# Patient Record
Sex: Female | Born: 1965 | Race: Black or African American | Hispanic: No | Marital: Married | Smoking: Never smoker
Health system: Southern US, Community
[De-identification: ages and names within clinical notes are randomized; demographics above are authoritative.]

## PROBLEM LIST (undated history)

## (undated) DIAGNOSIS — R809 Proteinuria, unspecified: Secondary | ICD-10-CM

## (undated) DIAGNOSIS — K589 Irritable bowel syndrome without diarrhea: Secondary | ICD-10-CM

## (undated) DIAGNOSIS — J302 Other seasonal allergic rhinitis: Secondary | ICD-10-CM

## (undated) HISTORY — PX: TUBAL LIGATION: SHX77

## (undated) HISTORY — DX: Other seasonal allergic rhinitis: J30.2

## (undated) HISTORY — DX: Proteinuria, unspecified: R80.9

## (undated) HISTORY — DX: Irritable bowel syndrome, unspecified: K58.9

---

## 1999-04-10 ENCOUNTER — Other Ambulatory Visit: Admission: RE | Admit: 1999-04-10 | Discharge: 1999-04-10 | Payer: Self-pay | Admitting: Internal Medicine

## 2000-02-05 ENCOUNTER — Other Ambulatory Visit: Admission: RE | Admit: 2000-02-05 | Discharge: 2000-02-05 | Payer: Self-pay | Admitting: Obstetrics and Gynecology

## 2000-02-24 ENCOUNTER — Encounter: Payer: Self-pay | Admitting: Obstetrics and Gynecology

## 2000-02-24 ENCOUNTER — Inpatient Hospital Stay (HOSPITAL_COMMUNITY): Admission: RE | Admit: 2000-02-24 | Discharge: 2000-02-24 | Payer: Self-pay | Admitting: Obstetrics and Gynecology

## 2000-02-25 ENCOUNTER — Encounter (INDEPENDENT_AMBULATORY_CARE_PROVIDER_SITE_OTHER): Payer: Self-pay

## 2000-02-25 ENCOUNTER — Ambulatory Visit (HOSPITAL_COMMUNITY): Admission: RE | Admit: 2000-02-25 | Discharge: 2000-02-25 | Payer: Self-pay | Admitting: Obstetrics and Gynecology

## 2000-09-16 HISTORY — PX: DILATION AND CURETTAGE OF UTERUS: SHX78

## 2001-03-25 ENCOUNTER — Other Ambulatory Visit: Admission: RE | Admit: 2001-03-25 | Discharge: 2001-03-25 | Payer: Self-pay | Admitting: Gynecology

## 2001-10-07 ENCOUNTER — Encounter: Payer: Self-pay | Admitting: Obstetrics and Gynecology

## 2001-10-07 ENCOUNTER — Inpatient Hospital Stay (HOSPITAL_COMMUNITY): Admission: AD | Admit: 2001-10-07 | Discharge: 2001-10-07 | Payer: Self-pay | Admitting: Obstetrics and Gynecology

## 2001-10-09 ENCOUNTER — Inpatient Hospital Stay (HOSPITAL_COMMUNITY): Admission: AD | Admit: 2001-10-09 | Discharge: 2001-10-12 | Payer: Self-pay | Admitting: Obstetrics and Gynecology

## 2001-10-13 ENCOUNTER — Encounter: Admission: RE | Admit: 2001-10-13 | Discharge: 2001-11-12 | Payer: Self-pay | Admitting: Obstetrics and Gynecology

## 2001-11-13 ENCOUNTER — Encounter: Admission: RE | Admit: 2001-11-13 | Discharge: 2001-12-13 | Payer: Self-pay | Admitting: Obstetrics and Gynecology

## 2001-11-19 ENCOUNTER — Other Ambulatory Visit: Admission: RE | Admit: 2001-11-19 | Discharge: 2001-11-19 | Payer: Self-pay | Admitting: Obstetrics and Gynecology

## 2003-09-14 ENCOUNTER — Inpatient Hospital Stay (HOSPITAL_COMMUNITY): Admission: AD | Admit: 2003-09-14 | Discharge: 2003-09-17 | Payer: Self-pay | Admitting: Obstetrics & Gynecology

## 2003-09-16 ENCOUNTER — Encounter (INDEPENDENT_AMBULATORY_CARE_PROVIDER_SITE_OTHER): Payer: Self-pay | Admitting: Specialist

## 2003-09-18 ENCOUNTER — Encounter: Admission: RE | Admit: 2003-09-18 | Discharge: 2003-10-18 | Payer: Self-pay | Admitting: Obstetrics and Gynecology

## 2003-10-28 ENCOUNTER — Other Ambulatory Visit: Admission: RE | Admit: 2003-10-28 | Discharge: 2003-10-28 | Payer: Self-pay | Admitting: Obstetrics and Gynecology

## 2004-07-17 ENCOUNTER — Ambulatory Visit: Payer: Self-pay | Admitting: Adult Health

## 2004-08-01 ENCOUNTER — Ambulatory Visit: Payer: Self-pay | Admitting: Critical Care Medicine

## 2005-05-02 ENCOUNTER — Other Ambulatory Visit: Admission: RE | Admit: 2005-05-02 | Discharge: 2005-05-02 | Payer: Self-pay | Admitting: Obstetrics and Gynecology

## 2007-01-19 ENCOUNTER — Emergency Department (HOSPITAL_COMMUNITY): Admission: EM | Admit: 2007-01-19 | Discharge: 2007-01-19 | Payer: Self-pay | Admitting: Emergency Medicine

## 2007-01-28 ENCOUNTER — Emergency Department (HOSPITAL_COMMUNITY): Admission: EM | Admit: 2007-01-28 | Discharge: 2007-01-28 | Payer: Self-pay | Admitting: Emergency Medicine

## 2007-03-04 ENCOUNTER — Other Ambulatory Visit: Admission: RE | Admit: 2007-03-04 | Discharge: 2007-03-04 | Payer: Self-pay | Admitting: Obstetrics and Gynecology

## 2007-07-17 IMAGING — CR DG CHEST 2V
2 series · 2 of 2 positions shown · non-contrast
Comparison: None.

CLINICAL DATA: Cough, wheezing. 
 CHEST - 2 VIEW:

[w chest pa]
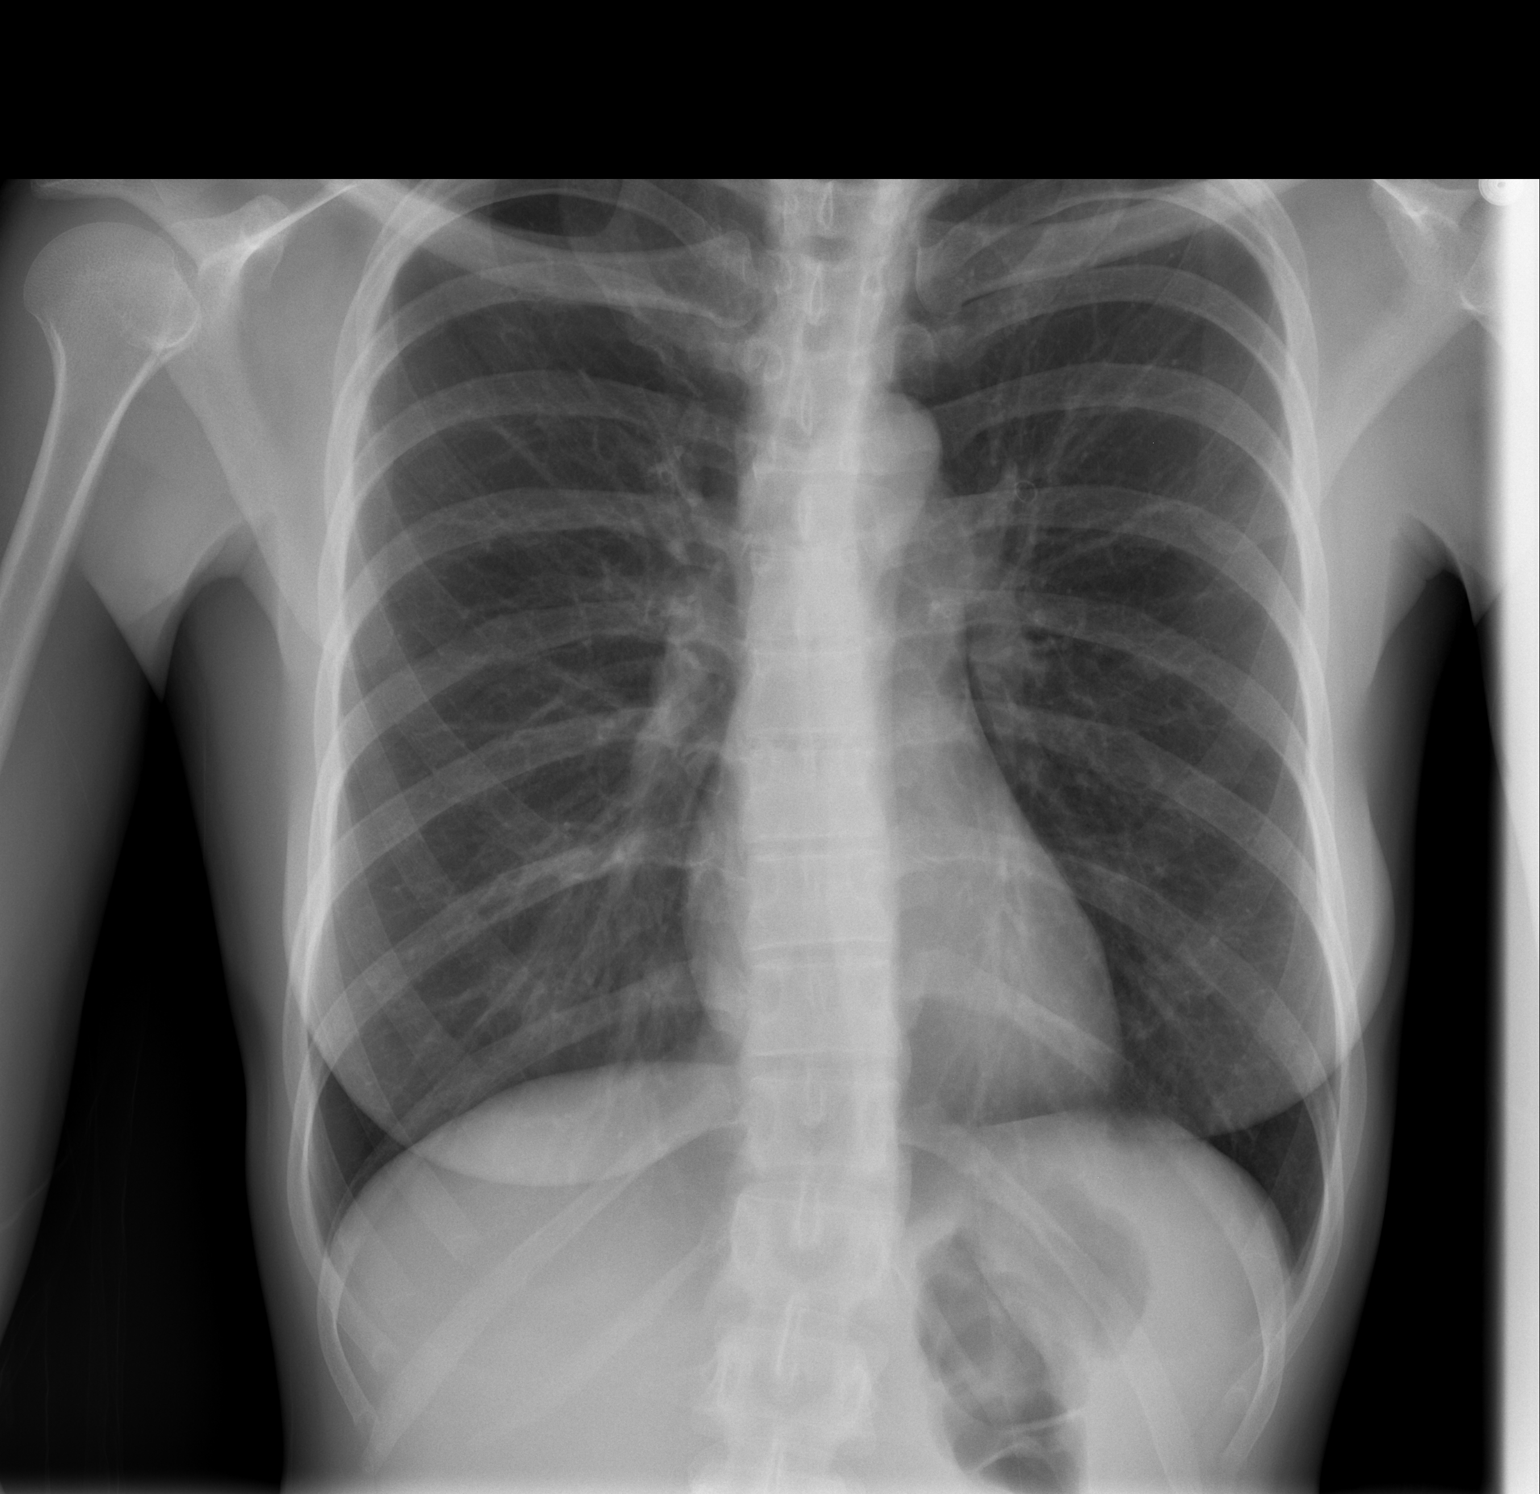

[w chest lat]
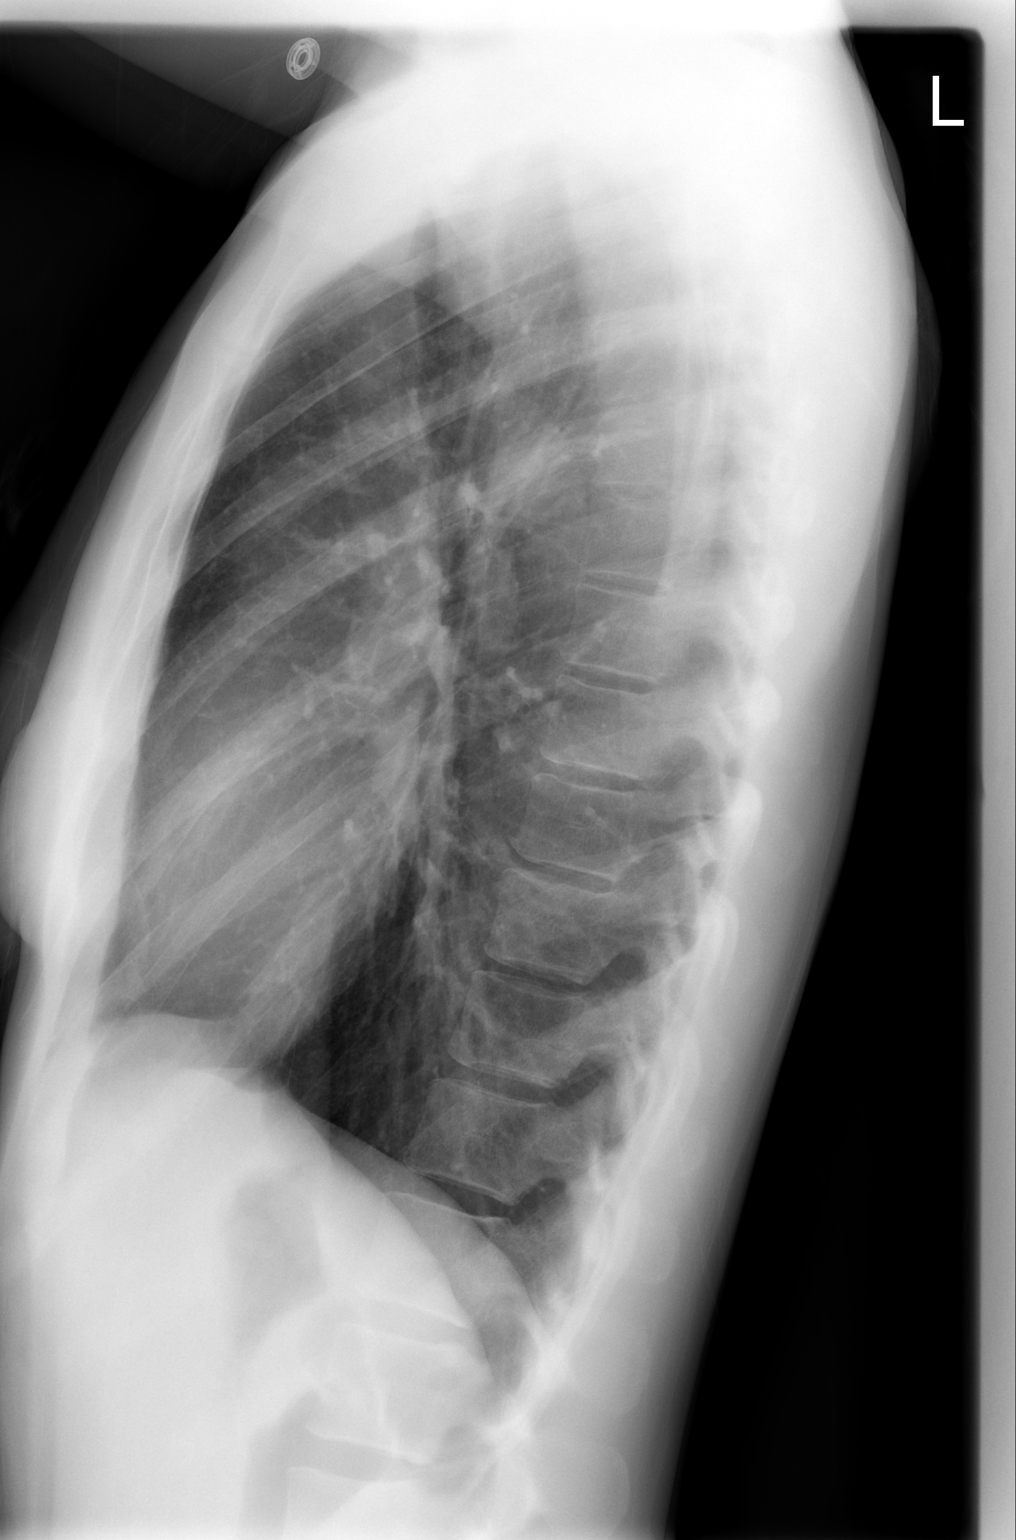

[2 of 2 positions shown; findings below may reference images not displayed]

FINDINGS: The heart size and mediastinal contours are within normal limits.  Both lungs are clear.  The visualized skeletal structures are unremarkable.
IMPRESSION: No active cardiopulmonary disease.

## 2008-03-24 ENCOUNTER — Other Ambulatory Visit: Admission: RE | Admit: 2008-03-24 | Discharge: 2008-03-24 | Payer: Self-pay | Admitting: Obstetrics and Gynecology

## 2009-03-16 ENCOUNTER — Other Ambulatory Visit: Admission: RE | Admit: 2009-03-16 | Discharge: 2009-03-16 | Payer: Self-pay | Admitting: Obstetrics and Gynecology

## 2011-02-01 NOTE — H&P (Signed)
[facility]  Patient:    Jade Nichols, Jade Nichols Visit Number: [id number] MRN: [id number]          Service Type: OBS Location: [facility] Attending Physician:  Trevor Iha Dictated by:   Juluis Mire, M.D. Admit Date:  10/07/2001 Discharge Date: 10/07/2001                           History and Physical  HISTORY OF PRESENT ILLNESS:   The patient is a 45 year old gravida 3 para 0 abortus 2 married black female, last menstrual period of Jan 14, 2001 giving her an estimated date of confinement of October 22, 2001 and an estimated date of confinement of 38 weeks.  This is consistent with initial exam and prior ultrasound.  The patient is admitted at the present time to undergo Cytotec ripening of the cervix and induction for management of a lowering platelet count and persistent proteinuria.  The patient does have a history of proteinuria of undetermined etiology.  She has been followed by Dr. Caryn Section, a local nephrologist.  Evidently she has had a normal creatinine clearance.  Because of worsening proteinuria in pregnancy, a 24-hour urine was performed that did reveal a creatinine clearance of 154 and a total protein of 660 mg in a 24-hour period.  This was done on January 17.  At that time a platelet count was also performed which was decreased with a value of 125,000.  Patient was brought in to [facility] on January 22 where a repeat platelet count was basically stable.  Her PIH panel was normal.  She had a nonstress test and an ultrasound that were unremarkable.  She was seen back in the office today. Her nonstress test was reactive.  A repeat platelet count at that time was 134,000.  The remaining PIH panel was within normal limits.  However, in view of the low platelet count and the proteinuria, the decision was to bring the patient in for Cytotec ripening of the cervix and subsequent induction of labor.  Other significant issues for the  patient is that she does have a history of asthmatic bronchitis.  She has been on Pulmicort inhaler in the past.  This has just been a mild issue for her.  She also has a history of Gilberts disease.  This results in some elevation of indirect bilirubin.  This may occur at stressful times or with certain medications.  Her liver function tests were within normal limits.  ALLERGIES:                    No known drug allergies.  MEDICATIONS:                  Include prenatal vitamins and inhaler.  PRENATAL LABORATORY DATA:     O positive, negative antibody screen.  Sickle cell prep was negative.  Nonreactive serology.  Positive rubella.  Negative hepatitis B surface antigen.  Negative HIV screen.  Her triple screen was negative.  Glucose challenge test was within normal limits.  Group B strep was negative.  HISTORY:                      For past medical history, family history, and social history please see prenatal records.  REVIEW OF SYSTEMS:            Noncontributory.  PHYSICAL EXAMINATION:  VITAL SIGNS:  Patient is afebrile with stable vital signs.  HEENT:                        Patient normocephalic.  Pupils equal, round, and reactive to light and accommodation.  Extraocular movements were intact. Sclerae and conjunctivae were clear.  Oropharynx clear.  NECK:                         Without thyromegaly.  BREASTS:                      No discrete masses.  LUNGS:                        Clear.  CARDIOVASCULAR:               Regular rhythm and rate without murmurs or gallops.  ABDOMEN:                      Confirms her gravid uterus consistent with dates.  Fetal heart tones are audible and reactive.  PELVIC:                       Cervix is fingertip, 75% effaced, vertex presenting.  EXTREMITIES:                  Trace edema.  NEUROLOGIC:                   Reveals deep tendon reflexes to be 2+, no clonus.  IMPRESSION:                   1. Intrauterine  pregnancy at 38 weeks                               2. Chronic proteinuria.                               3. Thrombocytopenia of undetermined etiology,                                  cannot rule out pregnancy-induced                                  hypertension.  PLAN:                         Patient is brought in to undergo Cytotec ripening of the cervix and induction.  Since her PIH panels were done today will not repeat at the present time.  The risks and benefits of induction have been discussed. Dictated by:   Juluis Mire, M.D. Attending Physician:  Trevor Iha DD:  10/09/01 TD:  10/10/01 Job: [id number]

## 2011-02-01 NOTE — Op Note (Signed)
[facility]  Patient:    Jade Nichols, Jade Nichols                MRN: [id number] Proc. Date: 02/25/00 Adm. Date:  [id number] Disc. Date: [id number] Attending:  Dierdre Forth Pearline                           Operative Report  PREOPERATIVE DIAGNOSIS:       First trimester missed abortion/ rule out ectopic.  POSTOPERATIVE DIAGNOSIS:      First trimester missed abortion/ rule out ectopic.  OPERATION:                    Suction dilatation and evacuation.  SURGEON:                      Janine Limbo, M.D.  ASSISTANT:  ANESTHESIA:                   IV sedation and paracervical block.  ESTIMATED BLOOD LOSS:  INDICATIONS:                  The patient is a 45 year old who had decreasing quantitative beta hCG values, but then increasing quantitative beta hCG values. An ultrasound was obtained that showed a nonviable gestation.  The patient understands the indications for her procedure and she accepts the risks.  FINDINGS:                     The patient is rh positive.  A small amount of products of conception were removed from a normal uterus.  No adnexal masses were appreciated.  DESCRIPTION OF PROCEDURE:     The patient was taken to the operating room where she was given medication through her IV line.  The perineum and vagina were prepped  with multiple layers of Betadine.  The bladder was drained of urine.  The patient was sterilely draped.  Examination under anesthesia was performed.  A paracervical block was placed using 10 cc of 1% Xylocaine without epinephrine.  The uterus was sounded to 8 cm.  The cervix was gradually dilated.  The uterine cavity was evacuated using a #8 suction curet and then a medium sharp curet.  The cavity was felt to be clean at the end of our procedure.  Hemostasis was thought to be adequate.  All instruments were removed.  The patient was returned to the supine position and then taken to the recovery  room in stable condition.  FOLLOW-UP:                    The patient was given a prescription for Vicodin ne to two p.o. q.4h. p.r.n. pain.  She will return to the office in two to three weeks for follow-up examination.  She was given a copy of the postoperative instruction sheet as prepared by [facility] for patients who have undergone a dilatation and curettage.  I will contact the patient with her pathology report. We may need to investigate further should there be signs of an ectopic pregnancy or of gestational trophoblastic neoplasia. DD:  02/25/00 TD:  02/26/00 Job: [id number]

## 2011-02-01 NOTE — Op Note (Signed)
[facility]  Patient:    Jade, Nichols Visit Number: [id number] MRN: [id number]          Service Type: OBS Location: 910A 9138 01 Attending Physician:  Frederich Balding Dictated by:   Juluis Mire, M.D. Proc. Date: 10/10/01 Admit Date:  10/09/2001                             Operative Report  INDICATIONS:                  The patient was admitted with an intrauterine pregnancy at 38 weeks, with the pregnancy complicated by proteinuria and dropping platelet counts.  She underwent Cytotec ripening of the cervix on the evening of January 24.  On the following morning, her cervix was 1 cm and 80% effaced,  Artificial rupture of membranes at that time revealed clear fluid. Fetal heart rate was very reassuring.  Right after rupture, we did have an episode of fetal tachycardia with some decelerations.  This did straighten out and the baseline became in the 140s to 150s and were reactive.  We began Pitocin augmentation.  She did have an epidural for anesthesia.  After the epidural, there were some episodes of bradycardia and deceleration that responded to ephedrine, position change, hydration and oxygen.  After this, the fetal heart rate remained reassuring.  She began the second stage of labor.  With pushing, we again saw a rise in the fetal heart rate and some repetitive decelerations.  They went down slowly and slowly recovered.  There were some position changes, and this seemed to straighten out for a while. The fetal vertex began presenting at the introitus.  Again, repetitive decelerations were noted.  The decision was made to proceed with vacuum extracted assisted vaginal delivery.  We discussed the risks of the procedure including subcutaneous hematoma that could require transfusions, intracranial bleeds and their management.  SURGEON:                      Juluis Mire, M.D.  DESCRIPTION OF PROCEDURE:     The patient was placed in the  dorsal lithotomy position.  A Foley had just been removed, so the bladder was empty.  She was prepped and draped.  The Tender-Touch vacuum extractor was put in place and brought up to appropriate suction.  With contraction, the fetal vertex was brought down to almost crowning.  The VE then slipped off.  She continued expulsive efforts at this point in time, with no further descent.  The vacuum extractor was reapplied and, again, the fetal vertex was brought down slightly further, but the vacuum extractor slipped off.  At this point in time, we decided to let the patient continue expulsive efforts.  However, at this point in time, we started having fetal bradycardia.  We infiltrated the perineum and cut a second-degree midline episiotomy.  The VE was put in place and, with the next contraction, the vertex delivered spontaneously.  There was a nuchal cord x 1 that was slightly tight, but was reduced.  The infant was delivered. Terminal ileum was a viable female with Apgars of 8/9.  Umbilical artery pH is still pending.  There was a second-degree midline episiotomy that was repaired with 2-0 chromic.  The placenta was delivered intact with apparent three-vessel cord.  Estimated blood loss was appropriate at 800 cc.  The mother and baby were doing  well in the postpartum period. Dictated by:   Juluis Mire, M.D. Attending Physician:  Frederich Balding DD:  10/10/01 TD:  10/11/01 Job: [id number]

## 2011-02-01 NOTE — Op Note (Signed)
NAME:  Jade Nichols, Jade Nichols                         ACCOUNT NO.:  [id number]   MEDICAL RECORD NO.:  [id number]                   PATIENT TYPE:  INP   LOCATION:  9110                                 FACILITY:  [facility]   PHYSICIAN:  Juluis Mire, M.D.                DATE OF BIRTH:  [birth year]   DATE OF PROCEDURE:  09/16/2003  DATE OF DISCHARGE:                                 OPERATIVE REPORT   PREOPERATIVE DIAGNOSIS:  Multiparity, desires sterility.   POSTOPERATIVE DIAGNOSIS:  Multiparity, desires sterility.   OPERATIVE PROCEDURE:  Postpartum bilateral tubal ligation.   SURGEON:  Juluis Mire, M.D.   ANESTHESIA:  Attempt at an epidural with failure, subsequent general  anesthesia.   ESTIMATED BLOOD LOSS:  Minimal.   PACKS AND DRAINS:  None.   INTRAOPERATIVE BLOOD REPLACED:  None.   COMPLICATIONS:  None.   INDICATIONS:  The patient is recently postpartum.  Is desirous of permanent  sterilization.  Potential irreversibility of sterilization is explained.  Alternatives for birth control were discussed.  A failure rate of one in 200  is quoted.  Failures can be in the form of ectopic pregnancies requiring  further surgical management.  The risks of the procedure have been discussed  including the risk of anesthetics, the risk of infection, the risk of  hemorrhage that could necessitate transfusion with the risk of AIDS or  hepatitis, the risk of injury to adjacent organs that could require further  exploratory surgery, risks of deep venous thrombosis and pulmonary embolus.  The patient expressed an understanding of the indications and risks.   The procedure was as follows:  The patient was taken to the OR and placed in  the supine position.  After failed epidural, the patient underwent general  endotracheal anesthesia.  The umbilical area was prepped out with Betadine  and draped in a sterile field.  A subumbilical incision made with a knife,  carried through subcutaneous tissue.   Fascia was entered sharply, incision  in the fascia extended laterally.  The peritoneum was identified and entered  sharply and no evidence of injury to adjacent organs.  The right tube was  identified and followed out to its fimbriated end.  A midsegment of tube was  elevated.  A hole was made in the avascular area of the mesosalpinx using  the Bovie.  Individual ligatures of 0 plain catgut were used to ligate off a  segment of tube.  The intervening segment of tube was excised.  The cut ends  of the tube were cauterized using the Bovie.  Hemostasis was excellent.  The  ovary was palpably normal.  The left tube was identified, followed out to  its fimbriated end.  A midsegment of tube was elevated through the incision.  A hole was made in the avascular area of the mesosalpinx.  Individual  ligatures of 0 plain catgut were used to ligate off  a segment of tube.  An  intervening segment of tube was excised.  The cut ends of the tube were  cauterized using the Bovie.  Hemostasis was excellent.  The left ovary was  palpably normal.  The  fascia was closed with a running suture of 0 Vicryl, skin was closed with  interrupted subcuticular sutures of 4-0 Vicryl and Dermabond.  Sponge,  instrument, and needle count were reported as correct by the circulating  nurse. The patient once extubated was transferred to the recovery room in  good condition.                                               Juluis Mire, M.D.    JSM/MEDQ  D:  09/16/2003  T:  09/16/2003  Job:  [id number]

## 2011-03-12 ENCOUNTER — Other Ambulatory Visit: Payer: Self-pay | Admitting: Obstetrics and Gynecology

## 2011-03-12 ENCOUNTER — Other Ambulatory Visit (HOSPITAL_COMMUNITY)
Admission: RE | Admit: 2011-03-12 | Discharge: 2011-03-12 | Disposition: A | Discharge status: Home / Self Care | Payer: BC Managed Care – PPO | Source: Ambulatory Visit | Attending: Obstetrics and Gynecology | Admitting: Obstetrics and Gynecology

## 2011-03-12 DIAGNOSIS — Z01419 Encounter for gynecological examination (general) (routine) without abnormal findings: Secondary | ICD-10-CM | POA: Insufficient documentation

## 2012-01-23 ENCOUNTER — Ambulatory Visit (INDEPENDENT_AMBULATORY_CARE_PROVIDER_SITE_OTHER): Payer: BC Managed Care – PPO | Admitting: Family Medicine

## 2012-01-23 ENCOUNTER — Encounter: Payer: Self-pay | Admitting: Family Medicine

## 2012-01-23 ENCOUNTER — Ambulatory Visit: Payer: BC Managed Care – PPO | Admitting: Family Medicine

## 2012-01-23 VITALS — BP 108/72 | HR 76 | Ht 68.0 in | Wt 127.0 lb

## 2012-01-23 DIAGNOSIS — R5383 Other fatigue: Secondary | ICD-10-CM

## 2012-01-23 DIAGNOSIS — K589 Irritable bowel syndrome without diarrhea: Secondary | ICD-10-CM

## 2012-01-23 DIAGNOSIS — R519 Headache, unspecified: Secondary | ICD-10-CM | POA: Insufficient documentation

## 2012-01-23 DIAGNOSIS — R51 Headache: Secondary | ICD-10-CM

## 2012-01-23 DIAGNOSIS — Z0000 Encounter for general adult medical examination without abnormal findings: Secondary | ICD-10-CM

## 2012-01-23 DIAGNOSIS — Z13220 Encounter for screening for lipoid disorders: Secondary | ICD-10-CM

## 2012-01-23 DIAGNOSIS — R5381 Other malaise: Secondary | ICD-10-CM

## 2012-01-23 LAB — POCT URINALYSIS DIPSTICK
Glucose, UA: NEGATIVE
Nitrite, UA: NEGATIVE
Urobilinogen, UA: NEGATIVE

## 2012-01-23 NOTE — Progress Notes (Signed)
Jade Nichols is a 46 y.o. female who presents for a complete physical.  She has the following concerns:  new patient fasting CPE, wanted to have PAP done but started menses this am-would prefer to come back another day for pap only. Also has had a bad HA x 6 days.   Previous PCP was Dr. Lovell Sheehan, but hasn't seen her in a few years.  Can't recall last tetanus.  Also going to [facility] more recently, and has been getting treatment for her asthma there.  Recalls getting flu shot, possibly pneumonia vaccine also.  Asthma--diagnosed at age 60.  Symptoms are seasonal, along with her allergies.  She starts pulmicort when symptoms begin, but usually ends up being seen in Urgent Care at least once a year (usually twice), and needs steroid shot.  Admits to using it sporadically, and not on a daily basis, just as needed. Hasn't needed rescue inhaler recently.  IBS--diagnosed years ago, h/o normal colonoscopy.  Currently has intermittent bloating, constipation. She reports that once the bowel symptoms start, she then develops headaches, nausea.  Usually has daily stools.  Headaches--eventually have a "breakthrough"--either vomits, has diarrhea, or both, and then is better and back on track.  Occurs every 2-3 months.  +photosensitivity.  Headaches seem to be linked to when she is off of her regular routine, and misses having her regular morning bowel movement.  Started period today, and usually gets diarrhea on day cycle starts.  Had diarrhea today. Started with headache 5 days ago. Headache is lasting longer than normal (on 6th day).  Has been moving bowels regularly, but small, feel like incompletely eliminating.  Having some belching, and "feeling bad".    Has been using Tylenol sinus, but hasn't helped like she it usually does for the headache. Also tried Aleve. Currently has mild nasal congestion, runny nose.  Mostly from L side of head, where headaches have been. Headaches--now top  of head, down back of neck.  Started at L temple.  She reports being under stress  Health Maintenance: There is no immunization history on file for this patient. Vaccines--await review of old records--ensure UTD on TdaP and pneumovax.  Got flu shot this past year. Last Pap smear: 02/2011 Last mammogram: 2 years ago Last colonoscopy: in her 43's Last DEXA: never Dentist: once yearly Ophtho: yearly Exercise: irregular, other than walking halls in her school  Past Medical History  Diagnosis Date  . Asthma age 47    no hospitalizations; seasonal  . Seasonal allergies   . IBS (irritable bowel syndrome)     colonoscopy in her 20's    Past Surgical History  Procedure Date  . Dilation and curettage of uterus 2002  . Tubal ligation     History   Social History  . Marital Status: Married    Spouse Name: N/A    Number of Children: 2  . Years of Education: N/A   Occupational History  . 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 Smoking status: Never Smoker   . Smokeless tobacco: Never Used  . Alcohol Use: No  . Drug Use: No  . Sexually Active: Yes -- Female partner(s)    Birth Control/ Protection: Surgical   Other Topics Concern  . Not on file   Social History Narrative   Married, 2 sons. No pets.  Husband is working out of [place] for the last 2 years    Family History  Problem  Relation Age of Onset  . Depression Mother   . Hyperlipidemia Mother   . Hypertension Mother   . Asthma Mother   . Arthritis Mother   . Hyperlipidemia Father   . Cancer Father     prostate  . Arthritis Sister   . Diabetes Maternal Aunt   . Heart disease Maternal Aunt     Current outpatient prescriptions:ALBUTEROL SULFATE HFA IN, Inhale 1-2 puffs into the lungs as needed., Disp: , Rfl: ;  budesonide (PULMICORT) 180 MCG/ACT inhaler, Inhale 2 puffs into the lungs 2 (two) times daily., Disp: , Rfl:   No Known Allergies  ROS:  The patient  denies anorexia, fever, weight changes,  vision changes, decreased hearing, ear pain, sore throat, breast concerns, chest pain, palpitations, dizziness, syncope, dyspnea on exertion, cough, swelling, melena, hematochezia, hematuria, incontinence, dysuria, irregular menstrual cycles, vaginal discharge, odor or itch, genital lesions, joint pains, numbness, tingling, weakness, tremor, suspicious skin lesions, depression, anxiety, abnormal bleeding/bruising, or enlarged lymph nodes. +headaches; +asthma.  Not needing rescue inhaler recently.  +burping (during episodes of GI complaints), constipation, diarrhea as per HPI Some burning in upper stomach with eating, currently, since having headache +stress, anxiety, fatigue, not sleeping well.  PHYSICAL EXAM: BP 108/72  Pulse 76  Ht 5\' 8"  (1.727 m)  Wt 127 lb (57.607 kg)  BMI 19.31 kg/m2  LMP 01/23/2012  General Appearance:    Alert, cooperative, no distress, appears stated age  Head:    Normocephalic, without obvious abnormality, atraumatic.  Mildly tender at L temporalis muscle  Eyes:    PERRL, conjunctiva/corneas clear, EOM's intact, fundi    benign  Ears:    Normal TM's and external ear canals  Nose:   Nares normal, mucosa mildly edematous, pale, no drainage or sinus tenderness  Throat:   Lips, mucosa, and tongue normal; teeth and gums normal  Neck:   Supple, no lymphadenopathy;  thyroid:  no   enlargement/tenderness/nodules; no carotid   bruit or JVD  Back:    Spine nontender, no curvature, ROM normal, no CVA     tenderness  Lungs:     Clear to auscultation bilaterally without wheezes, rales or     ronchi; respirations unlabored  Chest Wall:    No tenderness or deformity   Heart:    Regular rate and rhythm, S1 and S2 normal, no murmur, rub   or gallop  Breast Exam:    Deferred to future GYN exam (due to menstrual cycle)  Abdomen:     Soft, non-tender, nondistended, normoactive bowel sounds,    no masses, no hepatosplenomegaly  Genitalia:     Deferred to future GYN exam due to menses     Extremities:   No clubbing, cyanosis or edema  Pulses:   2+ and symmetric all extremities  Skin:   Skin color, texture, turgor normal, no rashes or lesions  Lymph nodes:   Cervical, supraclavicular, and axillary nodes normal  Neurologic:   CNII-XII intact, normal strength, sensation and gait; reflexes 2+ and symmetric throughout          Psych:   Normal mood, affect, hygiene and grooming.      ASSESSMENT/PLAN: 1. Routine general medical examination at a health care facility  POCT Urinalysis Dipstick, Visual acuity screening  2. Screening for lipoid disorders  Lipid panel, Lipid panel  3. Headache    4. IBS (irritable bowel syndrome)    5. Other malaise and fatigue  CBC with Differential, Comprehensive metabolic panel, TSH, Vitamin D 25  hydroxy, CBC with Differential, Comprehensive metabolic panel, TSH, Vitamin D 25 hydroxy    High fiber diet, consider stool softeners, daily exercise, increase fluids. Keep headache log.  Trial OTC migraine meds with more severe headaches (prior to nausea/vomiting, only if no response to her usual measures, ie Tylenol.)   Asthma--use pulmicort DAILY during allergy season. Yearly flu shots are recommended.  Use OTC antihistamines prn allergies. Consider adding Singulair if still having problems with allergies/asthma despite regular use of antihistamines and pulmicort.  Possible reflux currently--OTC med trial, diet.  Discussed monthly self breast exams and yearly mammograms after the age of 52; at least 30 minutes of aerobic activity at least 5 days/week; proper sunscreen use reviewed; healthy diet, including goals of calcium and vitamin D intake and alcohol recommendations (less than or equal to 1 drink/day) reviewed; regular seatbelt use; changing batteries in smoke detectors.  Immunization recommendations discussed.  Colonoscopy recommendations reviewed  Await records to review immunizations. Encouraged flu  shot yearly.  Return in 1-2 weeks for breast/pelvic exam

## 2012-01-23 NOTE — Patient Instructions (Addendum)
HEALTH MAINTENANCE RECOMMENDATIONS:  It is recommended that you get at least 30 minutes of aerobic exercise at least 5 days/week (for weight loss, you may need as much as 60-90 minutes). This can be any activity that gets your heart rate up. This can be divided in 10-15 minute intervals if needed, but try and build up your endurance at least once a week.  Weight bearing exercise is also recommended twice weekly.  Eat a healthy diet with lots of vegetables, fruits and fiber.  "Colorful" foods have a lot of vitamins (ie green vegetables, tomatoes, red peppers, etc).  Limit sweet tea, regular sodas and alcoholic beverages, all of which has a lot of calories and sugar.  Up to 1 alcoholic drink daily may be beneficial for women (unless trying to lose weight, watch sugars).  Drink a lot of water.  Calcium recommendations are 1200-1500 mg daily (1500 mg for postmenopausal women or women without ovaries), and vitamin D 1000 IU daily.  This should be obtained from diet and/or supplements (vitamins), and calcium should not be taken all at once, but in divided doses.  Monthly self breast exams and yearly mammograms for women over the age of 22 is recommended.  Sunscreen of at least SPF 30 should be used on all sun-exposed parts of the skin when outside between the hours of 10 am and 4 pm (not just when at beach or pool, but even with exercise, golf, tennis, and yard work!)  Use a sunscreen that says "broad spectrum" so it covers both UVA and UVB rays, and make sure to reapply every 1-2 hours.  Remember to change the batteries in your smoke detectors when changing your clock times in the spring and fall.  Use your seat belt every time you are in a car, and please drive safely and not be distracted with cell phones and texting while driving.  For your headaches-- Drink plenty of fluids.  There may be sinus component, so okay to continue tylenol Sinus as needed.  Take your zyrtec daily.  You may ALSO take 2 Aleve  twice daily with food as needed for headaches (not long-term, but over the next few days, up to a week)

## 2012-01-24 ENCOUNTER — Encounter: Payer: Self-pay | Admitting: Family Medicine

## 2012-01-24 LAB — CBC WITH DIFFERENTIAL/PLATELET
Basophils Absolute: 0 10*3/uL (ref 0.0–0.1)
Basophils Relative: 0 % (ref 0–1)
Eosinophils Absolute: 0.3 10*3/uL (ref 0.0–0.7)
Eosinophils Relative: 6 % — ABNORMAL HIGH (ref 0–5)
Lymphs Abs: 1.5 10*3/uL (ref 0.7–4.0)
MCH: 26 pg (ref 26.0–34.0)
MCV: 84.3 fL (ref 78.0–100.0)
Neutrophils Relative %: 52 % (ref 43–77)
Platelets: 239 10*3/uL (ref 150–400)
RBC: 4.58 MIL/uL (ref 3.87–5.11)
RDW: 14 % (ref 11.5–15.5)

## 2012-01-24 LAB — COMPREHENSIVE METABOLIC PANEL
Albumin: 4.5 g/dL (ref 3.5–5.2)
Alkaline Phosphatase: 58 U/L (ref 39–117)
BUN: 12 mg/dL (ref 6–23)
CO2: 24 mEq/L (ref 19–32)
Calcium: 9.4 mg/dL (ref 8.4–10.5)
Chloride: 105 mEq/L (ref 96–112)
Glucose, Bld: 77 mg/dL (ref 70–99)
Potassium: 4.4 mEq/L (ref 3.5–5.3)
Sodium: 141 mEq/L (ref 135–145)
Total Protein: 7.3 g/dL (ref 6.0–8.3)

## 2012-01-24 LAB — LIPID PANEL
Cholesterol: 141 mg/dL (ref 0–200)
Total CHOL/HDL Ratio: 2.7 Ratio

## 2012-02-06 ENCOUNTER — Other Ambulatory Visit: Payer: BC Managed Care – PPO | Admitting: Family Medicine

## 2012-02-13 ENCOUNTER — Other Ambulatory Visit: Payer: BC Managed Care – PPO | Admitting: Family Medicine

## 2012-02-26 ENCOUNTER — Other Ambulatory Visit (HOSPITAL_COMMUNITY)
Admission: RE | Admit: 2012-02-26 | Discharge: 2012-02-26 | Disposition: A | Discharge status: Home / Self Care | Payer: BC Managed Care – PPO | Source: Ambulatory Visit | Attending: Family Medicine | Admitting: Family Medicine

## 2012-02-26 ENCOUNTER — Encounter: Payer: Self-pay | Admitting: Family Medicine

## 2012-02-26 ENCOUNTER — Ambulatory Visit (INDEPENDENT_AMBULATORY_CARE_PROVIDER_SITE_OTHER): Payer: BC Managed Care – PPO | Admitting: Family Medicine

## 2012-02-26 VITALS — BP 108/68 | HR 64 | Ht 68.0 in | Wt 130.0 lb

## 2012-02-26 DIAGNOSIS — K648 Other hemorrhoids: Secondary | ICD-10-CM

## 2012-02-26 DIAGNOSIS — R809 Proteinuria, unspecified: Secondary | ICD-10-CM

## 2012-02-26 DIAGNOSIS — J309 Allergic rhinitis, unspecified: Secondary | ICD-10-CM | POA: Insufficient documentation

## 2012-02-26 DIAGNOSIS — Z01419 Encounter for gynecological examination (general) (routine) without abnormal findings: Secondary | ICD-10-CM

## 2012-02-26 LAB — HEMOCCULT GUIAC POC 1CARD (OFFICE): Fecal Occult Blood, POC: NEGATIVE

## 2012-02-26 LAB — POCT URINALYSIS DIPSTICK
Bilirubin, UA: NEGATIVE
Glucose, UA: NEGATIVE
Ketones, UA: NEGATIVE
Leukocytes, UA: NEGATIVE
Spec Grav, UA: 1.02

## 2012-02-26 LAB — HM PAP SMEAR: HM Pap smear: NORMAL

## 2012-02-26 MED ORDER — HYDROCORTISONE ACETATE 25 MG RE SUPP: 25.0000 mg | Freq: Two times a day (BID) | RECTAL | Status: AC

## 2012-02-26 MED ORDER — FLUTICASONE PROPIONATE 50 MCG/ACT NA SUSP: 2.0000 | Freq: Every day | NASAL | Status: DC

## 2012-02-26 NOTE — Progress Notes (Signed)
Patient presents for breast/pelvic exam.  She was on her period when she was here for her physical.    LMP 02/13/2012 Last pap 1 year ago Last mammogram 2 years ago She denies any breast concerns, vaginal discharge, irregular periods, pelvic pain.  She also presents with complaint of decreased sense of taste and smell.  She has ongoing nasal congestion and sniffling, worse in the mornings.  Has been using pulmicort regularly since last visit, and doing some nasal saline rinses.  Needed to use albuterol a few weeks ago, but okay since. Previously took nasal steroids in the past, but years ago.  Has been using zyrtec nightly, hasn't really been controlling symptoms.  Took actifed last night which seemed to help some.  She informs me of h/o proteinuria in the past.  Has had workup with nephrologist.  Asking about protein in urine--at last check she was on her menstrual cycle, so will repeat u/a today.  Past Medical History  Diagnosis Date  . Asthma age 46    no hospitalizations; seasonal  . Seasonal allergies   . IBS (irritable bowel syndrome)     colonoscopy in her 20's  . Proteinuria     had eval with ultrasound, saw nephro (Dr. Caryn Section)    Past Surgical History  Procedure Date  . Dilation and curettage of uterus 2002  . Tubal ligation     History   Social History  . Marital Status: Married    Spouse Name: N/A    Number of Children: 2  . Years of Education: N/A   Occupational History  . 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 Smoking status: Never Smoker   . Smokeless tobacco: Never Used  . Alcohol Use: No  . Drug Use: No  . Sexually Active: Yes -- Female partner(s)    Birth Control/ Protection: Surgical   Other Topics Concern  . Not on file   Social History Narrative   Married, 2 sons. No pets.  Husband is working out of [place] for the last 2 years    Family History  Problem Relation Age of Onset  . Depression  Mother   . Hyperlipidemia Mother   . Hypertension Mother   . Asthma Mother   . Arthritis Mother   . Hyperlipidemia Father   . Cancer Father     prostate  . Arthritis Sister   . Diabetes Maternal Aunt   . Heart disease Maternal Aunt     Current outpatient prescriptions:ALBUTEROL SULFATE HFA IN, Inhale 1-2 puffs into the lungs as needed., Disp: , Rfl: ;  budesonide (PULMICORT) 180 MCG/ACT inhaler, Inhale 2 puffs into the lungs 2 (two) times daily., Disp: , Rfl: ;  cetirizine (ZYRTEC) 10 MG tablet, Take 10 mg by mouth daily., Disp: , Rfl: ;  Triprolidine-Pseudoephedrine (ACTIFED PO), Take by mouth., Disp: , Rfl:  fluticasone (FLONASE) 50 MCG/ACT nasal spray, Place 2 sprays into the nose daily., Disp: 16 g, Rfl: 11;  hydrocortisone (ANUSOL-HC) 25 MG suppository, Place 1 suppository (25 mg total) rectally 2 (two) times daily., Disp: 12 suppository, Rfl: 1  No Known Allergies  ROS:  Denies fevers, sinus pain, headaches, dizziness, chest pain, palpitations, shortness of breath, cough, GI complaints, GU complaints, skin concerns, joint pains, or other complaints.  Will be looking for a job in [place] where her husband has been working.  PHYSICAL EXAM: BP 108/68  Pulse 64  Ht 5\' 8"  (1.727 m)  Wt  130 lb (58.968 kg)  BMI 19.77 kg/m2  LMP 02/13/2012 Well developed, pleasant female in no distress HEENT:  PERRL, EOMI, conjunctiva clear.  TM's and EAC's normal.  OP clear. Nasal mucosa mod edematous, clear mucus.  Sinuses nontender. Neck: no lymphadenopathy or thyromegaly Heart: regular rate and rhythm without murmur Lungs: clear bilaterally Abdomen: soft, nontender Breasts:  No nipple inversion, nipple discharge, skin dimpling, masses or axillary lymphadenopathy Pelvic exam: normal external genitalia without lesions.  BUS and vagina normal.  Cervix without lesions, cervical motion tenderness.  Uterus normal, adnexa normal--no masses or tenderness.  Pap performed. Rectal: small noninflamed  external hemorrhoids. Normal sphincter tone.  Palpable internal hemorrhoids.   Heme negative stool. Skin: no rash Psych: normal mood, affect, hygiene and grooming  ASSESSMENT/PLAN: 1. Routine gynecological examination  Cytology - PAP  2. Internal hemorrhoid  hydrocortisone (ANUSOL-HC) 25 MG suppository  3. Allergic rhinitis, cause unspecified  fluticasone (FLONASE) 50 MCG/ACT nasal spray  4. Proteinuria  POCT Urinalysis Dipstick   Mammogram due--pt reminded to schedule. Instructed on monthly self breast exams.  Allergies:  Suboptimally controlled.  Continue Zyrtec. Add Flonase.  May also use decongestant as needed  Proteinuria--repeat urine; mostly to get baseline (when not on cycle), has already had workup.  Internal hemorrhoids--anusol HC suppository prn.  High fiber diet, increase water intake. Asthma--improved.  Continue daily pulmicort with albuterol as needed.  40 minute visit.

## 2012-02-26 NOTE — Patient Instructions (Signed)
Start Flonase--2 sprays each nostril every day.  If/when you feel like allergies are resolved, but back to just 1 spray into each nostril daily for a couple of week, and if allergies still haven't recurred, okay to stop, with plans to restart when they start to flare up again with the next season change.  This needs to be used every day.  You might find that you don't need to use the zyrtec every single day if the Flonase is controlling the allergies completely, but some people need to use both every day.  You can continue to use a decongestant if needed, especially for sinus congestion/pain, but you may find you need it less often once the flonase is in your system.  Please call to schedule your screening mammogram.  Use the anusol HC suppositories only if/when your internal hemorrhoid flares up.  Drink plenty of water and eat a high fiber diet to try and void flare-up of hemorrhoids.

## 2012-03-02 ENCOUNTER — Encounter: Payer: Self-pay | Admitting: Family Medicine

## 2012-04-30 ENCOUNTER — Encounter: Payer: Self-pay | Admitting: Internal Medicine

## 2013-02-22 ENCOUNTER — Encounter: Payer: Self-pay | Admitting: Family Medicine

## 2013-02-22 ENCOUNTER — Ambulatory Visit (INDEPENDENT_AMBULATORY_CARE_PROVIDER_SITE_OTHER): Payer: BC Managed Care – PPO | Admitting: Family Medicine

## 2013-02-22 VITALS — BP 108/70 | HR 72 | Ht 67.0 in | Wt 134.0 lb

## 2013-02-22 DIAGNOSIS — J45909 Unspecified asthma, uncomplicated: Secondary | ICD-10-CM

## 2013-02-22 DIAGNOSIS — Z0000 Encounter for general adult medical examination without abnormal findings: Secondary | ICD-10-CM

## 2013-02-22 DIAGNOSIS — K648 Other hemorrhoids: Secondary | ICD-10-CM

## 2013-02-22 DIAGNOSIS — J309 Allergic rhinitis, unspecified: Secondary | ICD-10-CM

## 2013-02-22 LAB — POCT URINALYSIS DIPSTICK
Glucose, UA: NEGATIVE
Ketones, UA: NEGATIVE
Spec Grav, UA: 1.025
Urobilinogen, UA: NEGATIVE

## 2013-02-22 MED ORDER — FLUTICASONE PROPIONATE 50 MCG/ACT NA SUSP: 2.0000 | Freq: Every day | NASAL | Status: AC

## 2013-02-22 MED ORDER — BUDESONIDE 180 MCG/ACT IN AEPB: 2.0000 | INHALATION_SPRAY | Freq: Two times a day (BID) | RESPIRATORY_TRACT | Status: AC

## 2013-02-22 NOTE — Patient Instructions (Addendum)
HEALTH MAINTENANCE RECOMMENDATIONS:  It is recommended that you get at least 30 minutes of aerobic exercise at least 5 days/week (for weight loss, you may need as much as 60-90 minutes). This can be any activity that gets your heart rate up. This can be divided in 10-15 minute intervals if needed, but try and build up your endurance at least once a week.  Weight bearing exercise is also recommended twice weekly.  Eat a healthy diet with lots of vegetables, fruits and fiber.  "Colorful" foods have a lot of vitamins (ie green vegetables, tomatoes, red peppers, etc).  Limit sweet tea, regular sodas and alcoholic beverages, all of which has a lot of calories and sugar.  Up to 1 alcoholic drink daily may be beneficial for women (unless trying to lose weight, watch sugars).  Drink a lot of water.  Calcium recommendations are 1200-1500 mg daily (1500 mg for postmenopausal women or women without ovaries), and vitamin D 1000 IU daily.  This should be obtained from diet and/or supplements (vitamins), and calcium should not be taken all at once, but in divided doses.  Monthly self breast exams and yearly mammograms for women over the age of 71 is recommended.  Sunscreen of at least SPF 30 should be used on all sun-exposed parts of the skin when outside between the hours of 10 am and 4 pm (not just when at beach or pool, but even with exercise, golf, tennis, and yard work!)  Use a sunscreen that says "broad spectrum" so it covers both UVA and UVB rays, and make sure to reapply every 1-2 hours.  Remember to change the batteries in your smoke detectors when changing your clock times in the spring and fall.  Use your seat belt every time you are in a car, and please drive safely and not be distracted with cell phones and texting while driving.   Please get immunizations records from Dr. Lovell Sheehan.  If you cannot, we should at least give you a TdaP.  Please get yearly flu shots (in the fall)

## 2013-02-22 NOTE — Progress Notes (Signed)
Chief Complaint  Patient presents with  . Annual Exam    nonfasting annual exam with pap. Patient just had eye exam with Dr.McFarland last week.UA showed 2+ blood and protein. Also has some anxiety issues still. Has been having nasal congestion, did see ENT and had CT diagnosed with sinusitis. Is having some nasal congestion currently and has lost her sense of smell. Has a pain that where her neck meets her upper back x 3 months, mostly at night. Gets a summer rash in 4 different places, can you rx a cream.    Kessie Croston is a 47 y.o. female who presents for a complete physical.  She has the following concerns:  Saw ENT 8 months ago, had CT scan, treated for sinusitis with prednisone, antibiotic.  Symptoms resolved, and didn't f/u because insurance didn't cover (?deductible, cost of scan?).  Currently complaining of congestion x 1 month.  Using nasal steroid, zyrtec and pulmicort as directed.  Not needing albuterol.   Also doing nasal rinses periodically.  Nasal mucus is clear, no coughing.  Constant sniffling.  Admits to tasting a lot of the nasal spray, and swallowing it after sniffing the med.  Hasn't been using decongestant.  Denies fever, sinus pain, shortness of breath/wheezing.  Asthma--does well until allergy season.  Hasn't needed to use albuterol in a very long time.  She didn't get a flu shot last year.  She believes she had a pneumonia vaccine through Dr. Della Goo in the past.  She continues to have some anxiety.  Husband is still working in [facility] [employer] [facility], gets home about every 3rd weekend.  Not getting regular exercise currently. Not getting counseling.  Sometimes has palpitations.  She is on her last day of her menstrual cycle (proteinuria is chronic, had urology w/u in past).  Denies dysuria.  Health Maintenance: There is no immunization history on file for this patient. Vaccines--still awaiting review of old records--need to see if UTD on TdaP and pneumovax.  We never received  records from Dr. Lovell Sheehan, where she thinks she got vaccines, including pneumonia vaccine and tetanus--unsure if Td or TdaP Didn't get a flu shot this past year Last Pap smear: 02/2012 Last mammogram: 2013 Last colonoscopy: in her 20's  Last DEXA: never  Labs reviewed from last year. Dentist: once yearly  Ophtho: yearly  Exercise: irregular, other than walking halls in her school  Past Medical History  Diagnosis Date  . Asthma age 17    no hospitalizations; seasonal  . Seasonal allergies   . IBS (irritable bowel syndrome)     colonoscopy in her 20's  . Proteinuria     had eval with ultrasound, saw nephro (Dr. Caryn Section)    Past Surgical History  Procedure Laterality Date  . Dilation and curettage of uterus  2002  . Tubal ligation      History   Social History  . Marital Status: Married    Spouse Name: N/A    Number of Children: 2  . Years of Education: N/A   Occupational History  . 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 Smoking status: Never Smoker   . Smokeless tobacco: Never Used  . Alcohol Use: No  . Drug Use: No  . Sexually Active: Yes -- Female partner(s)    Birth Control/ Protection: Surgical   Other Topics Concern  . Not on file   Social History Narrative   Married, 2 sons. No pets.  Husband is working  out of [place] for the last 2 years    Family History  Problem Relation Age of Onset  . Depression Mother   . Hyperlipidemia Mother   . Hypertension Mother   . Asthma Mother   . Arthritis Mother   . Hyperlipidemia Father   . Cancer Father     prostate  . Arthritis Sister   . Diabetes Maternal Aunt   . Heart disease Maternal Aunt     Current outpatient prescriptions:budesonide (PULMICORT) 180 MCG/ACT inhaler, Inhale 2 puffs into the lungs 2 (two) times daily., Disp: 1 each, Rfl: 11;  cetirizine (ZYRTEC) 10 MG tablet, Take 10 mg by mouth daily., Disp: , Rfl: ;  fluticasone (FLONASE) 50 MCG/ACT nasal  spray, Place 2 sprays into the nose daily., Disp: 16 g, Rfl: 11;  ALBUTEROL SULFATE HFA IN, Inhale 1-2 puffs into the lungs as needed., Disp: , Rfl:  Triprolidine-Pseudoephedrine (ACTIFED PO), Take by mouth., Disp: , Rfl:   No Known Allergies  ROS: The patient denies anorexia, fever, weight changes (some gain, related to drinking sweet tea--up 4 pounds from last year), vision changes, decreased hearing, ear pain, sore throat, breast concerns, chest pain, dizziness, syncope, dyspnea on exertion, cough, swelling, melena, hematochezia, hematuria, incontinence, dysuria, irregular menstrual cycles, vaginal discharge, odor or itch, genital lesions, joint pains, numbness, tingling, weakness, tremor, suspicious skin lesions,  abnormal bleeding/bruising, or enlarged lymph nodes.  No longer having many headaches. IBS has been better; denies constipation, diarrhea. +internal hemorrhoid, which sometimes pops out.  Not bleeding. +stress, anxiety, fatigue, not sleeping well.  Sometimes heart skips beats (related to anxiety, caffeine)  PHYSICAL EXAM:  BP 108/70  Pulse 72  Ht 5\' 7"  (1.702 m)  Wt 134 lb (60.782 kg)  BMI 20.98 kg/m2  LMP 02/17/2013  General Appearance:    Alert, cooperative, no distress, appears stated age. +sniffling constantly, sounds congested  Head:    Normocephalic, without obvious abnormality, atraumatic  Eyes:    PERRL, conjunctiva/corneas clear, EOM's intact, fundi    benign  Ears:    Normal TM's and external ear canals  Nose:   Nares with moderate mucosal edema, pale, clear drainage in nares.  no sinus tenderness  Throat:   Lips, mucosa, and tongue normal; teeth and gums normal  Neck:   Supple, no lymphadenopathy;  thyroid:  no   enlargement/tenderness/nodules; no carotid   bruit or JVD  Back:    Spine nontender, no curvature, ROM normal, no CVA     tenderness  Lungs:     Clear to auscultation bilaterally without wheezes, rales or     ronchi; respirations unlabored  Chest Wall:     No tenderness or deformity   Heart:    Regular rate and rhythm, S1 and S2 normal, no murmur, rub   or gallop  Breast Exam:    No tenderness, masses, or nipple discharge or inversion.  Some mild fibrocystic changes bilaterally, nontender, no focal mass.  No axillary lymphadenopathy  Abdomen:     Soft, non-tender, nondistended, normoactive bowel sounds,    no masses, no hepatosplenomegaly  Genitalia:    Normal external genitalia without lesions.  BUS and vagina normal; no cervical motion tenderness. No abnormal vaginal discharge.  Uterus and adnexa not enlarged, nontender, no masses.  Pap not performed  Rectal:    Normal tone, no tenderness; +internal hemorrhoid palpable.  guaiac negative stool  Extremities:   No clubbing, cyanosis or edema  Pulses:   2+ and symmetric all extremities  Skin:  Skin color, texture, turgor normal, no rashes or lesions  Lymph nodes:   Cervical, supraclavicular, and axillary nodes normal  Neurologic:   CNII-XII intact, normal strength, sensation and gait; reflexes 2+ and symmetric throughout          Psych:   Normal mood, affect, hygiene and grooming.   I ASSESSMENT/PLAN: Routine general medical examination at a health care facility - Plan: POCT Urinalysis Dipstick  Allergic rhinitis, cause unspecified - Plan: fluticasone (FLONASE) 50 MCG/ACT nasal spray  Unspecified asthma(493.90) - Plan: budesonide (PULMICORT) 180 MCG/ACT inhaler  Internal hemorrhoid  No labs needed this year (normal last year). No pap needed--every 3 years (every 5 if next pap has HPV done with it); due again 02/2015 Discussed monthly self breast exams and yearly mammograms after the age of 56; at least 30 minutes of aerobic activity at least 5 days/week; proper sunscreen use reviewed; healthy diet, including goals of calcium and vitamin D intake and alcohol recommendations (less than or equal to 1 drink/day) reviewed; regular seatbelt use; changing batteries in smoke detectors.  Immunization  recommendations discussed--check on date/type of tetanus, and date of pneumonia vaccine.  Colonoscopy recommendations reviewed--age 85   Allergies--suboptimally controlled.  Possibly related to techniquewith steroid spray.  Will try gentler sniffing.  If not improving, can try changing to a different steroid (ie try Nasacort).  May use decongestant prn, and sinus rinses.  Reviewed signs/symptoms of infection.  Anxiety--reviewed stress reduction, including exercise.  Consider counseling.  (rash in HPI not mentioned today with MD, nor neck pain)  F/u 1 year sooner prn for any of the other issues she wants addressed

## 2013-06-16 ENCOUNTER — Encounter: Payer: Self-pay | Admitting: Family Medicine

## 2014-02-23 ENCOUNTER — Encounter: Payer: BC Managed Care – PPO | Admitting: Family Medicine

## 2014-03-14 LAB — HM MAMMOGRAPHY: HM Mammogram: NORMAL

## 2014-03-16 ENCOUNTER — Encounter: Payer: Self-pay | Admitting: *Deleted

## 2014-05-02 ENCOUNTER — Ambulatory Visit
Admission: RE | Admit: 2014-05-02 | Discharge: 2014-05-02 | Disposition: A | Discharge status: Home / Self Care | Payer: BC Managed Care – PPO | Source: Ambulatory Visit | Attending: Family Medicine | Admitting: Family Medicine

## 2014-05-02 ENCOUNTER — Other Ambulatory Visit: Payer: Self-pay | Admitting: Family Medicine

## 2014-05-02 DIAGNOSIS — R2689 Other abnormalities of gait and mobility: Secondary | ICD-10-CM

## 2014-05-02 DIAGNOSIS — M25552 Pain in left hip: Secondary | ICD-10-CM

## 2014-07-18 ENCOUNTER — Encounter: Payer: Self-pay | Admitting: Family Medicine

## 2014-10-28 IMAGING — CR DG HIP (WITH OR WITHOUT PELVIS) 2-3V*L*
2 series · 2 of 2 positions shown · non-contrast
Comparison: None.

CLINICAL DATA: Left hip pain for 3 months.

EXAM:
LEFT HIP - COMPLETE 2+ VIEW

[view not recorded (1 of 2)]
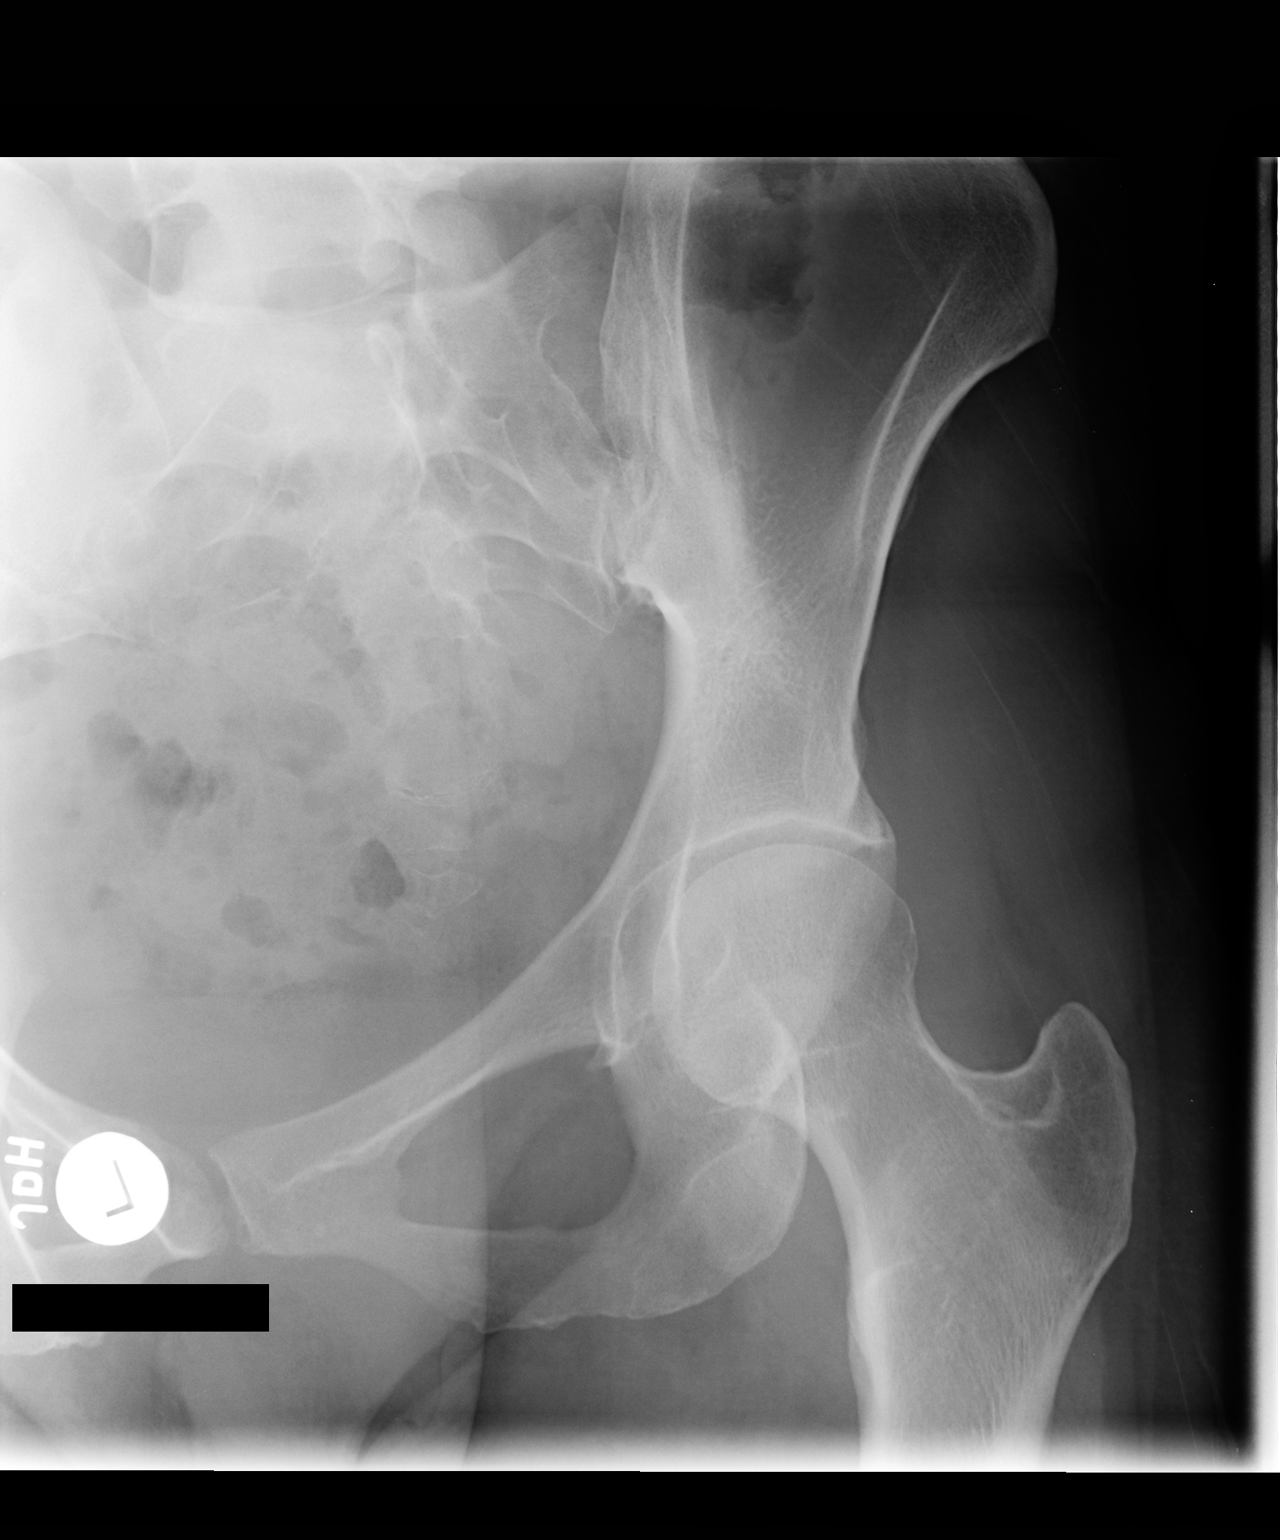

[view not recorded (2 of 2)]
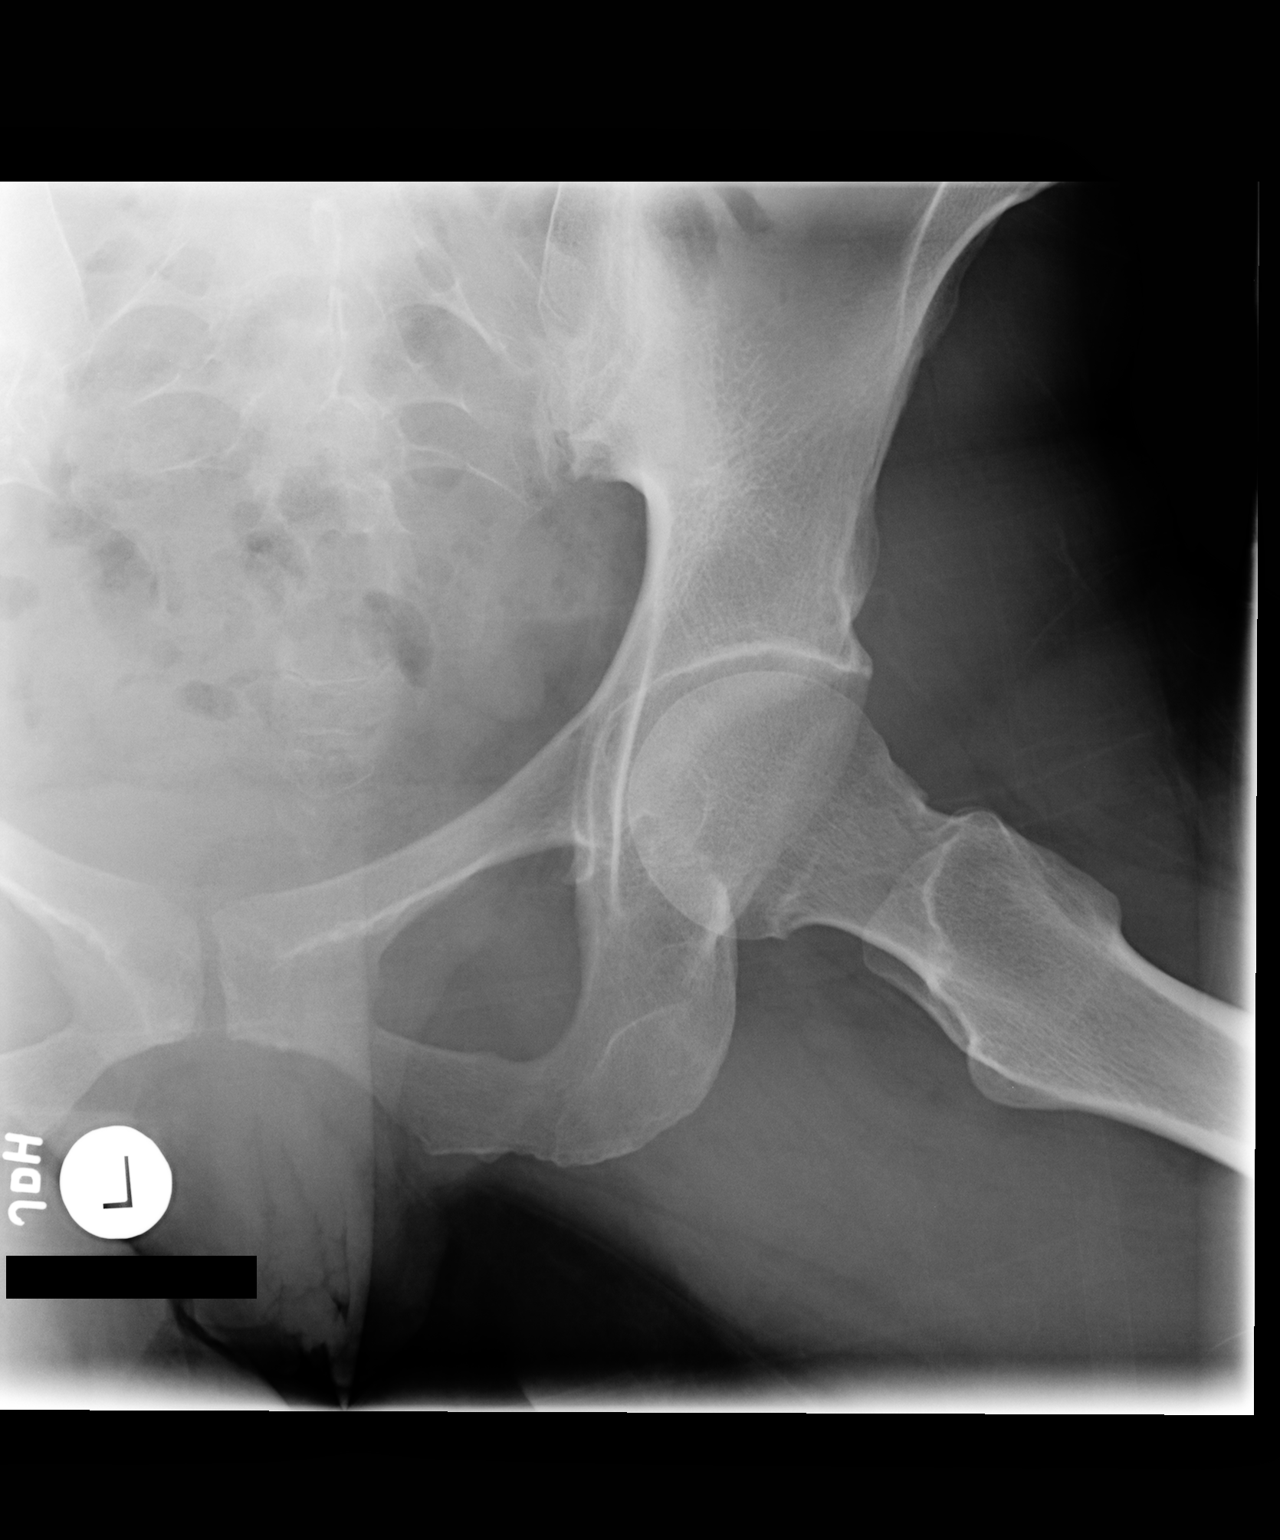

[2 of 2 positions shown; findings below may reference images not displayed]

FINDINGS: No fracture. No bone lesion. Minimal marginal osteophyte formation
noted along the posterior inferior femoral head. No other
arthropathic change. Soft tissues are unremarkable.
IMPRESSION: Minimal left hip arthropathic change.  No other abnormality.

## 2016-05-21 ENCOUNTER — Other Ambulatory Visit (HOSPITAL_COMMUNITY)
Admission: RE | Admit: 2016-05-21 | Discharge: 2016-05-21 | Disposition: A | Discharge status: Home / Self Care | Payer: BC Managed Care – PPO | Source: Ambulatory Visit | Attending: Obstetrics and Gynecology | Admitting: Obstetrics and Gynecology

## 2016-05-21 ENCOUNTER — Other Ambulatory Visit: Payer: Self-pay | Admitting: Obstetrics and Gynecology

## 2016-05-21 DIAGNOSIS — Z1151 Encounter for screening for human papillomavirus (HPV): Secondary | ICD-10-CM | POA: Diagnosis present

## 2016-05-21 DIAGNOSIS — Z01419 Encounter for gynecological examination (general) (routine) without abnormal findings: Secondary | ICD-10-CM | POA: Diagnosis present

## 2016-05-23 LAB — CYTOLOGY - PAP

## 2019-02-14 ENCOUNTER — Telehealth: Payer: Self-pay | Admitting: Hematology

## 2019-02-14 ENCOUNTER — Other Ambulatory Visit: Payer: BC Managed Care – PPO

## 2019-02-14 DIAGNOSIS — Z20822 Contact with and (suspected) exposure to covid-19: Secondary | ICD-10-CM

## 2019-02-14 NOTE — Telephone Encounter (Signed)
Patients 2 sons were seen on 02/03/19 by Cyndia Skeeters and have potentially been exposed. /

## 2019-02-15 LAB — NOVEL CORONAVIRUS, NAA: SARS-CoV-2, NAA: NOT DETECTED

## 2019-06-21 ENCOUNTER — Other Ambulatory Visit: Payer: Self-pay | Admitting: Obstetrics and Gynecology

## 2019-06-21 ENCOUNTER — Other Ambulatory Visit (HOSPITAL_COMMUNITY)
Admission: RE | Admit: 2019-06-21 | Discharge: 2019-06-21 | Disposition: A | Discharge status: Home / Self Care | Payer: BC Managed Care – PPO | Source: Ambulatory Visit | Attending: Obstetrics and Gynecology | Admitting: Obstetrics and Gynecology

## 2019-06-21 DIAGNOSIS — Z01419 Encounter for gynecological examination (general) (routine) without abnormal findings: Secondary | ICD-10-CM | POA: Diagnosis present

## 2019-06-29 LAB — CYTOLOGY - PAP
Diagnosis: NEGATIVE
High risk HPV: NEGATIVE

## 2023-03-18 ENCOUNTER — Other Ambulatory Visit (HOSPITAL_COMMUNITY)
Admission: RE | Admit: 2023-03-18 | Discharge: 2023-03-18 | Disposition: A | Discharge status: Home / Self Care | Payer: BC Managed Care – PPO | Source: Ambulatory Visit | Attending: Obstetrics and Gynecology | Admitting: Obstetrics and Gynecology

## 2023-03-18 ENCOUNTER — Other Ambulatory Visit: Payer: Self-pay | Admitting: Obstetrics and Gynecology

## 2023-03-18 DIAGNOSIS — Z01419 Encounter for gynecological examination (general) (routine) without abnormal findings: Secondary | ICD-10-CM | POA: Insufficient documentation

## 2023-03-21 LAB — CYTOLOGY - PAP
Comment: NEGATIVE
Diagnosis: NEGATIVE
High risk HPV: NEGATIVE
